# Patient Record
Sex: Male | Born: 1990 | Race: Black or African American | Hispanic: No | Marital: Single | Smoking: Current every day smoker
Health system: Southern US, Community
[De-identification: ages and names within clinical notes are randomized; demographics above are authoritative.]

---
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
## 2012-02-11 ENCOUNTER — Emergency Department (HOSPITAL_COMMUNITY): Payer: No Typology Code available for payment source

## 2012-02-11 ENCOUNTER — Emergency Department (HOSPITAL_COMMUNITY)
Admission: EM | Admit: 2012-02-11 | Discharge: 2012-02-11 | Disposition: A | Discharge status: Home / Self Care | Payer: No Typology Code available for payment source | Attending: Emergency Medicine | Admitting: Emergency Medicine

## 2012-02-11 DIAGNOSIS — S199XXA Unspecified injury of neck, initial encounter: Secondary | ICD-10-CM | POA: Insufficient documentation

## 2012-02-11 DIAGNOSIS — T148XXA Other injury of unspecified body region, initial encounter: Secondary | ICD-10-CM

## 2012-02-11 DIAGNOSIS — Y92410 Unspecified street and highway as the place of occurrence of the external cause: Secondary | ICD-10-CM | POA: Insufficient documentation

## 2012-02-11 DIAGNOSIS — IMO0002 Reserved for concepts with insufficient information to code with codable children: Secondary | ICD-10-CM | POA: Insufficient documentation

## 2012-02-11 DIAGNOSIS — Y9389 Activity, other specified: Secondary | ICD-10-CM | POA: Insufficient documentation

## 2012-02-11 DIAGNOSIS — Z23 Encounter for immunization: Secondary | ICD-10-CM | POA: Insufficient documentation

## 2012-02-11 DIAGNOSIS — T07XXXA Unspecified multiple injuries, initial encounter: Secondary | ICD-10-CM | POA: Insufficient documentation

## 2012-02-11 DIAGNOSIS — S0003XA Contusion of scalp, initial encounter: Secondary | ICD-10-CM | POA: Insufficient documentation

## 2012-02-11 DIAGNOSIS — S0993XA Unspecified injury of face, initial encounter: Secondary | ICD-10-CM | POA: Insufficient documentation

## 2012-02-11 MED ORDER — TETANUS-DIPHTH-ACELL PERTUSSIS 5-2.5-18.5 LF-MCG/0.5 IM SUSP
0.5000 mL | Freq: Once | INTRAMUSCULAR | Status: AC
  Administered 2012-02-11: 0.5 mL via INTRAMUSCULAR
  Filled 2012-02-11: qty 0.5

## 2012-02-11 MED ORDER — MORPHINE SULFATE 4 MG/ML IJ SOLN
4.0000 mg | Freq: Once | INTRAMUSCULAR | Status: AC
  Administered 2012-02-11: 4 mg via INTRAVENOUS
  Filled 2012-02-11: qty 1

## 2012-02-11 MED ORDER — HYDROCODONE-ACETAMINOPHEN 5-325 MG PO TABS: 1.0000 | ORAL_TABLET | Freq: Four times a day (QID) | ORAL | Status: DC | PRN

## 2012-02-11 MED ORDER — ONDANSETRON HCL 4 MG/2ML IJ SOLN
4.0000 mg | Freq: Once | INTRAMUSCULAR | Status: AC
  Administered 2012-02-11: 4 mg via INTRAVENOUS
  Filled 2012-02-11: qty 2
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
## 2012-02-25 ENCOUNTER — Encounter (HOSPITAL_COMMUNITY): Payer: Self-pay | Admitting: *Deleted

## 2012-02-25 ENCOUNTER — Emergency Department (HOSPITAL_COMMUNITY): Payer: No Typology Code available for payment source

## 2012-02-25 ENCOUNTER — Emergency Department (HOSPITAL_COMMUNITY)
Admission: EM | Admit: 2012-02-25 | Discharge: 2012-02-25 | Disposition: A | Discharge status: Home / Self Care | Payer: No Typology Code available for payment source | Attending: Emergency Medicine | Admitting: Emergency Medicine

## 2012-02-25 DIAGNOSIS — F17200 Nicotine dependence, unspecified, uncomplicated: Secondary | ICD-10-CM | POA: Insufficient documentation

## 2012-02-25 DIAGNOSIS — G8911 Acute pain due to trauma: Secondary | ICD-10-CM | POA: Insufficient documentation

## 2012-02-25 DIAGNOSIS — M25579 Pain in unspecified ankle and joints of unspecified foot: Secondary | ICD-10-CM | POA: Insufficient documentation

## 2012-02-25 DIAGNOSIS — M25476 Effusion, unspecified foot: Secondary | ICD-10-CM | POA: Insufficient documentation

## 2012-02-25 DIAGNOSIS — R209 Unspecified disturbances of skin sensation: Secondary | ICD-10-CM | POA: Insufficient documentation

## 2012-02-25 DIAGNOSIS — S99919A Unspecified injury of unspecified ankle, initial encounter: Secondary | ICD-10-CM

## 2012-02-25 DIAGNOSIS — M25473 Effusion, unspecified ankle: Secondary | ICD-10-CM | POA: Insufficient documentation

## 2012-02-25 MED ORDER — OXYCODONE-ACETAMINOPHEN 5-325 MG PO TABS
2.0000 | ORAL_TABLET | Freq: Once | ORAL | Status: AC
  Administered 2012-02-25: 2 via ORAL
  Filled 2012-02-25: qty 2

## 2012-02-25 MED ORDER — OXYCODONE-ACETAMINOPHEN 5-325 MG PO TABS: 2.0000 | ORAL_TABLET | ORAL | Status: DC | PRN

## 2012-02-25 NOTE — ED Provider Notes (Signed)
Medical screening examination/treatment/procedure(s) were performed by non-physician practitioner and as supervising physician I was immediately available for consultation/collaboration.  Dow Blahnik L Laranda Burkemper, MD 02/25/12 1659 
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
## 2013-08-27 IMAGING — CR DG TIBIA/FIBULA 2V*R*
4 series · 4 of 4 positions shown · non-contrast
Comparison: None.

CLINICAL DATA: Pain and swelling.

RIGHT TIBIA AND FIBULA - 2 VIEW

[x tib-fib ap right (1 of 2)]
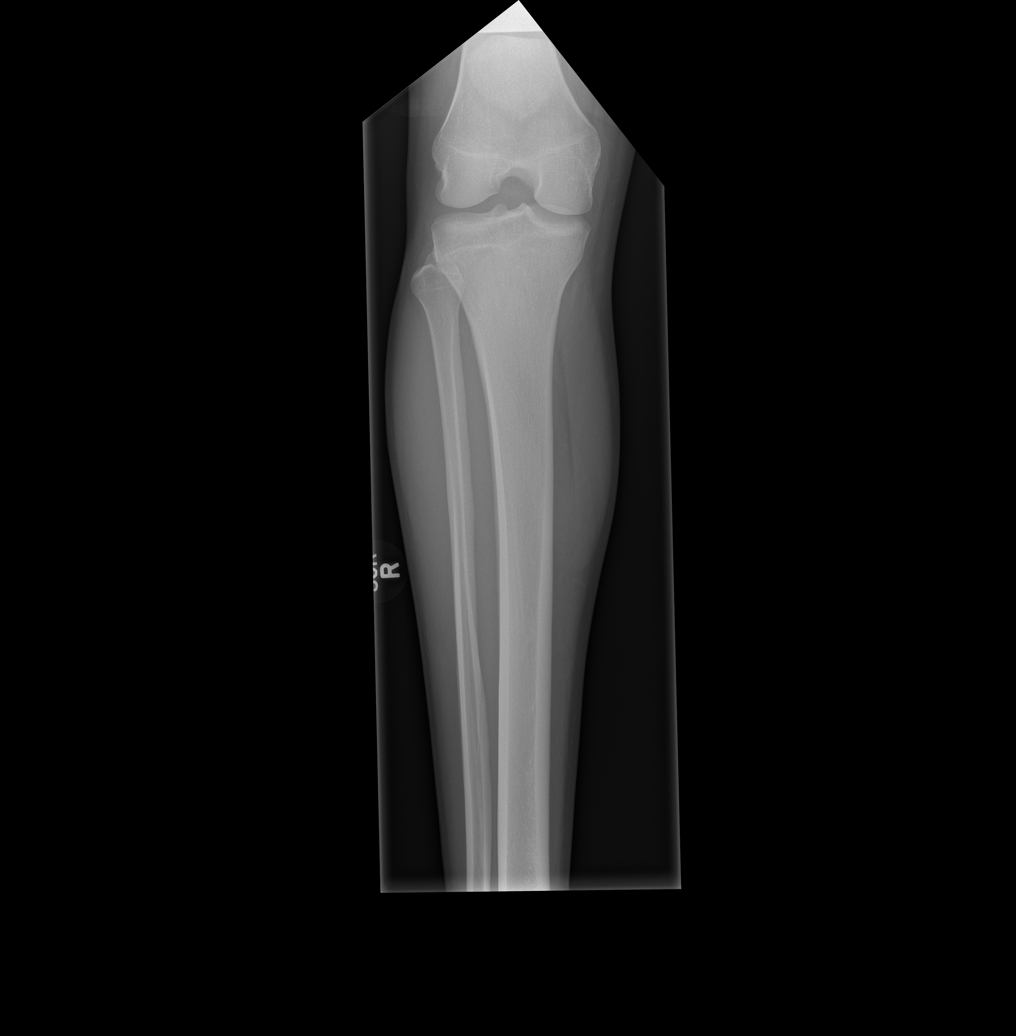

[x tib-fib ap right (2 of 2)]
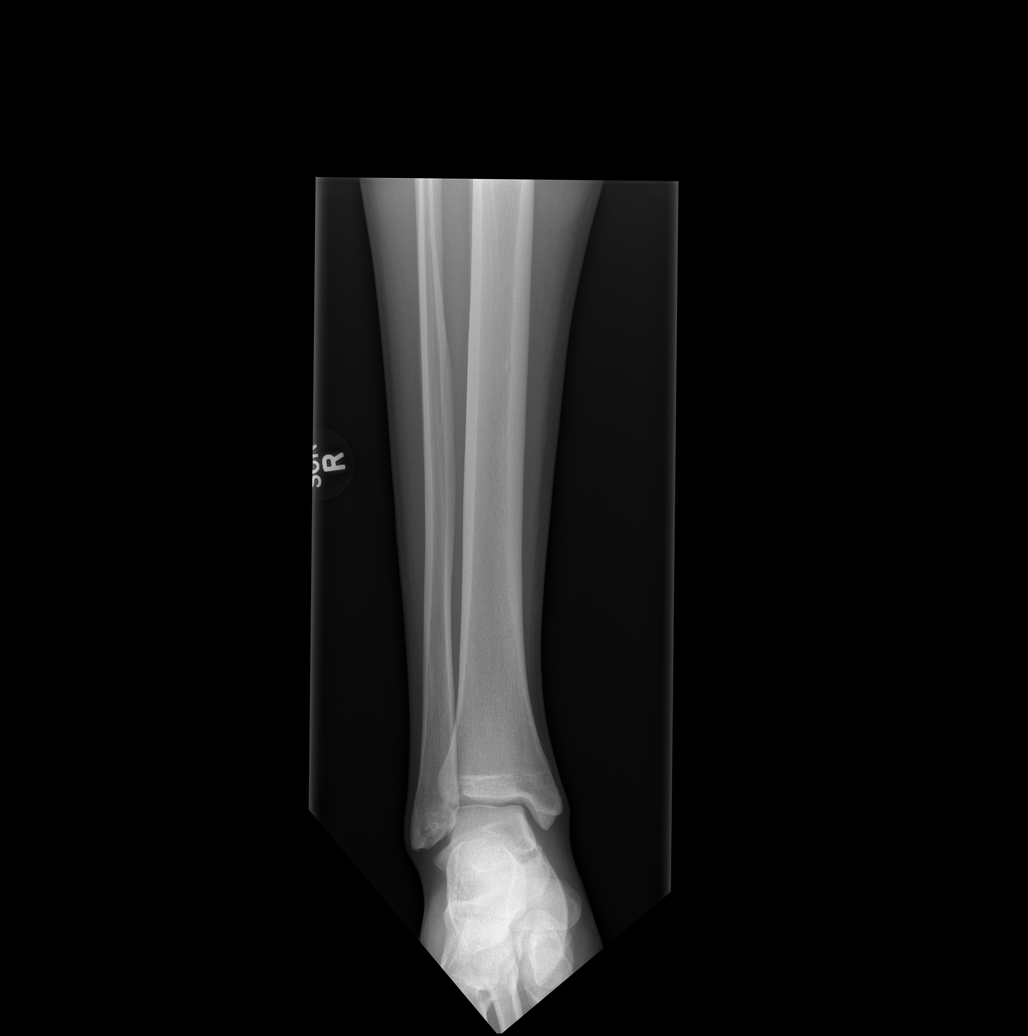

[x tib-fib lat right (1 of 2)]
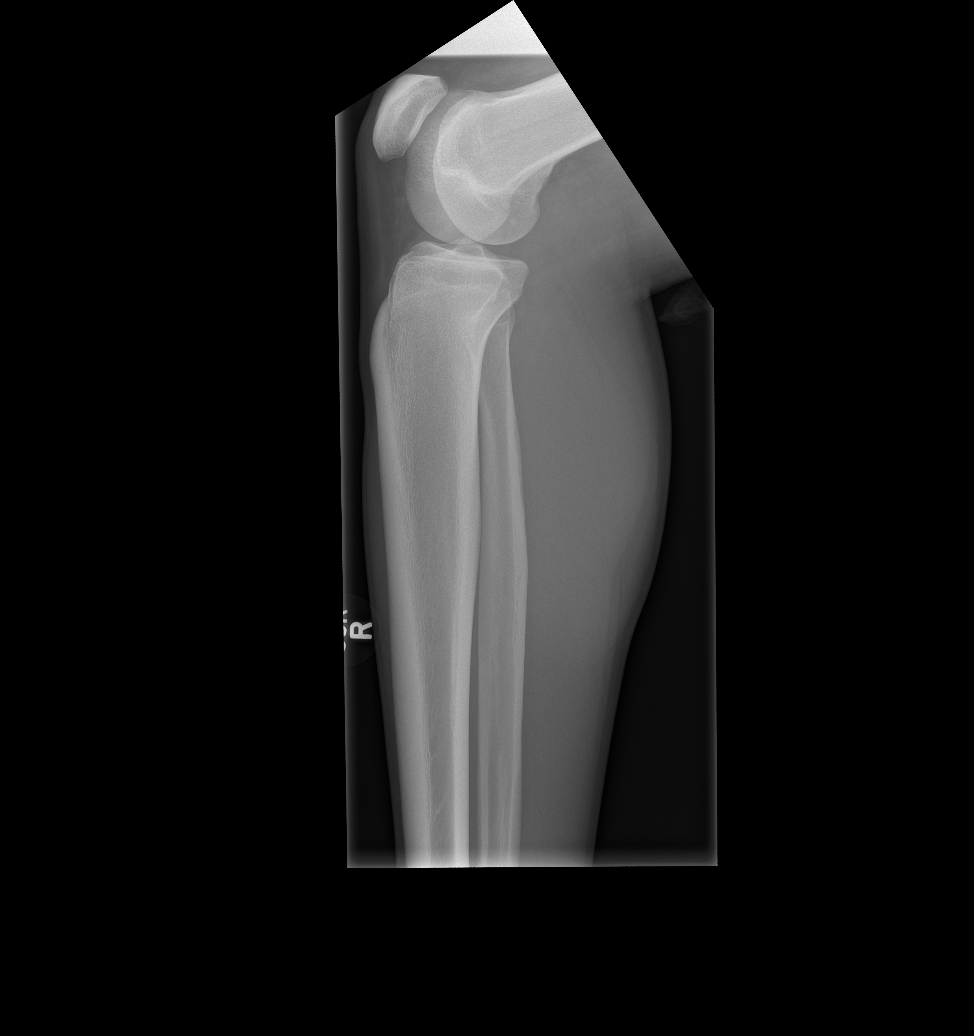

[x tib-fib lat right (2 of 2)]
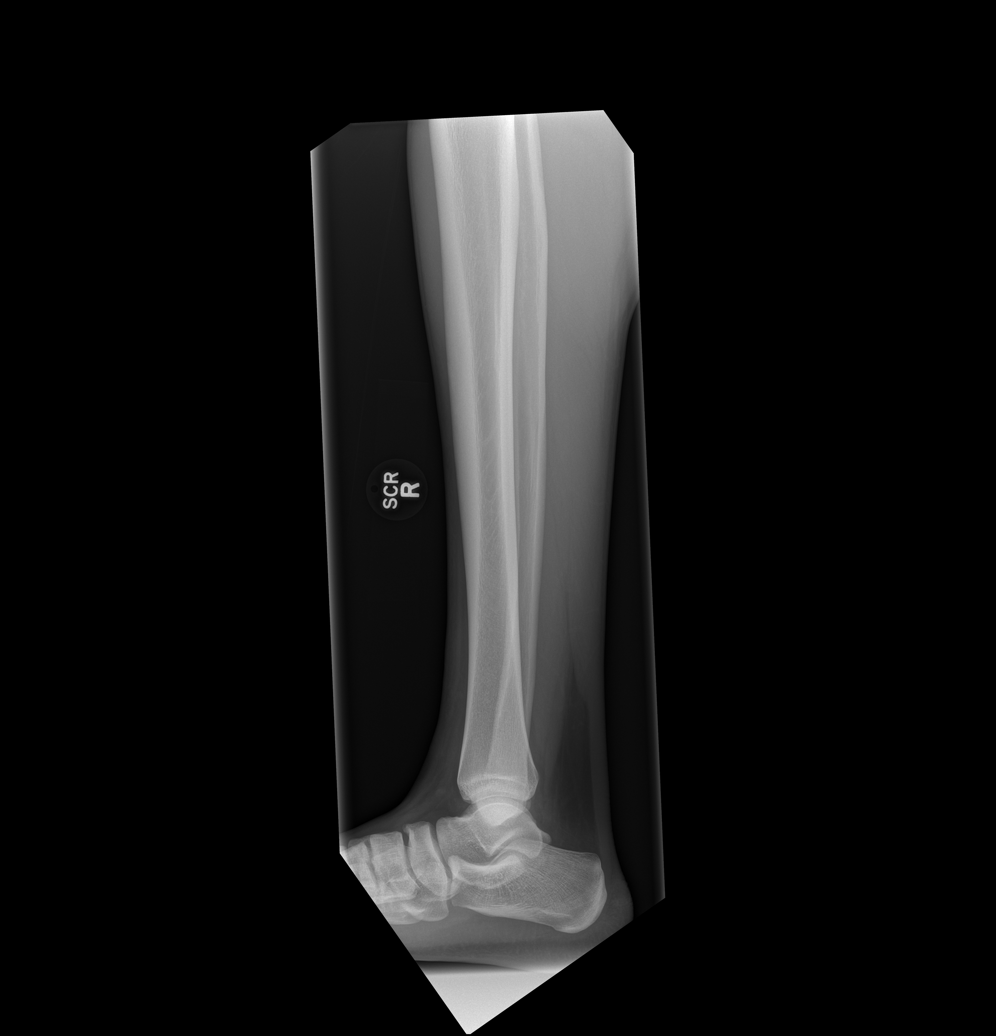

[4 of 4 positions shown; findings below may reference images not displayed]

FINDINGS: No acute osseous abnormality.
IMPRESSION: No acute osseous abnormality.

## 2018-10-15 ENCOUNTER — Encounter (HOSPITAL_COMMUNITY): Payer: Self-pay | Admitting: Emergency Medicine

## 2018-10-15 ENCOUNTER — Ambulatory Visit (HOSPITAL_COMMUNITY)
Admission: EM | Admit: 2018-10-15 | Discharge: 2018-10-15 | Disposition: A | Discharge status: Home / Self Care | Payer: Self-pay | Attending: Family Medicine | Admitting: Family Medicine

## 2018-10-15 ENCOUNTER — Other Ambulatory Visit: Payer: Self-pay

## 2018-10-15 DIAGNOSIS — S39012A Strain of muscle, fascia and tendon of lower back, initial encounter: Secondary | ICD-10-CM

## 2018-10-15 MED ORDER — CYCLOBENZAPRINE HCL 5 MG PO TABS: 5.0000 mg | ORAL_TABLET | Freq: Every day | ORAL | 0 refills | Status: DC

## 2018-10-15 MED ORDER — PREDNISONE 50 MG PO TABS: ORAL_TABLET | ORAL | 0 refills | Status: DC

## 2018-10-15 NOTE — ED Triage Notes (Signed)
Pt presents to [facility] for assessment of left lower back pain x 3 days, patient works for a [employer].

## 2018-10-15 NOTE — ED Notes (Signed)
Patient able to ambulate independently  
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
## 2019-01-08 ENCOUNTER — Other Ambulatory Visit: Payer: Self-pay

## 2019-01-08 ENCOUNTER — Ambulatory Visit: Payer: HRSA Program | Attending: Internal Medicine

## 2019-01-08 DIAGNOSIS — Z20822 Contact with and (suspected) exposure to covid-19: Secondary | ICD-10-CM

## 2019-01-08 DIAGNOSIS — Z20828 Contact with and (suspected) exposure to other viral communicable diseases: Secondary | ICD-10-CM | POA: Insufficient documentation

## 2019-01-09 LAB — NOVEL CORONAVIRUS, NAA: SARS-CoV-2, NAA: NOT DETECTED

## 2019-01-09 NOTE — Progress Notes (Signed)
Order(s) created erroneously. Erroneous order ID: [id number]  Order moved by: Brigitte Pulse  Order move date/time: 01/09/2019 2:40 PM  Source Patient: [id number]  Source Contact: 01/08/2019  Destination Patient: [id number]  Destination Contact: 01/08/2019

## 2019-01-09 NOTE — Progress Notes (Signed)
Order(s) created erroneously. Erroneous order ID: [id number]  Order moved by: Alyzae Hawkey M  Order move date/time: 01/09/2019 2:40 PM  Source Patient: [id number]  Source Contact: 01/08/2019  Destination Patient: [id number]  Destination Contact: 01/08/2019 

## 2019-01-09 NOTE — Progress Notes (Signed)
Orders moved to this encounter.  

## 2023-07-19 ENCOUNTER — Ambulatory Visit (HOSPITAL_COMMUNITY)
Admission: EM | Admit: 2023-07-19 | Discharge: 2023-07-19 | Disposition: A | Discharge status: Home / Self Care | Payer: Self-pay | Attending: Emergency Medicine | Admitting: Emergency Medicine

## 2023-07-19 ENCOUNTER — Encounter (HOSPITAL_COMMUNITY): Payer: Self-pay

## 2023-07-19 DIAGNOSIS — K029 Dental caries, unspecified: Secondary | ICD-10-CM

## 2023-07-19 DIAGNOSIS — K0889 Other specified disorders of teeth and supporting structures: Secondary | ICD-10-CM

## 2023-07-19 DIAGNOSIS — K047 Periapical abscess without sinus: Secondary | ICD-10-CM

## 2023-07-19 MED ORDER — IBUPROFEN 800 MG PO TABS: 800.0000 mg | ORAL_TABLET | Freq: Three times a day (TID) | ORAL | 0 refills | Status: DC

## 2023-07-19 MED ORDER — AMOXICILLIN-POT CLAVULANATE 875-125 MG PO TABS: 1.0000 | ORAL_TABLET | Freq: Two times a day (BID) | ORAL | 0 refills | Status: AC

## 2023-07-19 MED ORDER — LIDOCAINE VISCOUS HCL 2 % MT SOLN: 15.0000 mL | OROMUCOSAL | 0 refills | Status: AC | PRN

## 2023-07-19 NOTE — Discharge Instructions (Addendum)
 Augmentin twice daily for 7 days. Take with food to avoid upset stomach.  Ibuprofen every 6 hours, alternate with tylenol  Lidocaine swish and spit as often as needed  Dental resources are included in this packet and another sheet is attached. Please follow up as soon as able!

## 2023-07-19 NOTE — ED Triage Notes (Signed)
 Pt c/o lt lower toothache x1wk and causing a headache. States taken tylenol  and BC powders with little relief.
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
## 2023-12-21 ENCOUNTER — Encounter (HOSPITAL_COMMUNITY): Payer: Self-pay

## 2023-12-21 ENCOUNTER — Ambulatory Visit (HOSPITAL_COMMUNITY)
Admission: EM | Admit: 2023-12-21 | Discharge: 2023-12-21 | Disposition: A | Discharge status: Home / Self Care | Payer: Self-pay | Attending: Emergency Medicine | Admitting: Emergency Medicine

## 2023-12-21 DIAGNOSIS — K0889 Other specified disorders of teeth and supporting structures: Secondary | ICD-10-CM

## 2023-12-21 MED ORDER — KETOROLAC TROMETHAMINE 30 MG/ML IJ SOLN
INTRAMUSCULAR | Status: AC
  Filled 2023-12-21: qty 1

## 2023-12-21 MED ORDER — KETOROLAC TROMETHAMINE 10 MG PO TABS: 10.0000 mg | ORAL_TABLET | Freq: Four times a day (QID) | ORAL | 0 refills | Status: AC | PRN

## 2023-12-21 MED ORDER — AMOXICILLIN-POT CLAVULANATE 875-125 MG PO TABS: 1.0000 | ORAL_TABLET | Freq: Two times a day (BID) | ORAL | 0 refills | Status: AC

## 2023-12-21 MED ORDER — KETOROLAC TROMETHAMINE 30 MG/ML IJ SOLN
30.0000 mg | Freq: Once | INTRAMUSCULAR | Status: AC
  Administered 2023-12-21: 30 mg via INTRAMUSCULAR

## 2023-12-21 NOTE — Discharge Instructions (Signed)
 Start taking Augmentin  twice daily for 7 days for dental infection coverage. You were given an injection of Toradol in clinic today to help with your pain. I also prescribed additional Toradol that you can take for your pain.  Avoid taking any more Toradol for at least 8 hours after receiving injection in clinic today.  After this you can take Toradol every 6 hours as needed for pain.  Do not take this with any NSAIDs including ibuprofen , Motrin , Advil , Aleve, and naproxen. You can take 500 to 1000 mg of Tylenol  every 6-8 hours as needed for breakthrough pain. I have attached a list of low-cost dental resources that you can follow-up with for further evaluation and management of your dental pain.
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
## 2023-12-21 NOTE — ED Triage Notes (Signed)
 Right side upper tooth pain started a couple days ago. Took Motrin  with no relief.
